# Patient Record
Sex: Female | Born: 1965 | Race: White | Hispanic: No | Marital: Married
Health system: Midwestern US, Academic
[De-identification: ages and names within clinical notes are randomized; demographics above are authoritative.]

---

## 2017-01-27 ENCOUNTER — Encounter: Admit: 2017-01-27 | Discharge: 2017-01-28

## 2017-02-01 ENCOUNTER — Encounter: Admit: 2017-02-01 | Discharge: 2017-02-02

## 2017-03-09 ENCOUNTER — Encounter: Admit: 2017-03-09 | Discharge: 2017-03-10

## 2017-03-16 ENCOUNTER — Encounter: Admit: 2017-03-16 | Discharge: 2017-03-17

## 2017-03-18 ENCOUNTER — Encounter: Admit: 2017-03-18 | Discharge: 2017-03-18 | Payer: Commercial Managed Care - PPO

## 2017-03-19 ENCOUNTER — Encounter: Admit: 2017-03-19 | Discharge: 2017-03-19 | Payer: Commercial Managed Care - PPO

## 2017-03-24 ENCOUNTER — Encounter: Admit: 2017-03-24 | Discharge: 2017-03-24 | Payer: Commercial Managed Care - PPO

## 2017-03-24 ENCOUNTER — Ambulatory Visit: Admit: 2017-03-24 | Discharge: 2017-03-24 | Payer: 59

## 2017-03-24 DIAGNOSIS — J189 Pneumonia, unspecified organism: Principal | ICD-10-CM

## 2017-03-24 DIAGNOSIS — I10 Essential (primary) hypertension: ICD-10-CM

## 2017-03-24 DIAGNOSIS — R911 Solitary pulmonary nodule: Principal | ICD-10-CM

## 2017-03-24 DIAGNOSIS — F17200 Nicotine dependence, unspecified, uncomplicated: ICD-10-CM

## 2017-03-25 ENCOUNTER — Encounter: Admit: 2017-03-25 | Discharge: 2017-03-25 | Payer: Commercial Managed Care - PPO

## 2017-03-25 DIAGNOSIS — I10 Essential (primary) hypertension: ICD-10-CM

## 2017-03-25 DIAGNOSIS — J189 Pneumonia, unspecified organism: Principal | ICD-10-CM

## 2017-03-26 ENCOUNTER — Encounter: Admit: 2017-03-26 | Discharge: 2017-03-26 | Payer: Commercial Managed Care - PPO

## 2017-03-26 LAB — CBC AND DIFF
Lab: 0.8 %
Lab: 10 fL (ref 7.5–12.5)
Lab: 13 % (ref 11.0–15.0)
Lab: 162 {cells}/uL (ref 15–500)
Lab: 2.1 %
Lab: 250 10*3/uL (ref 140–400)
Lab: 263 {cells}/uL (ref 850–3900)
Lab: 29 pg (ref >60)
Lab: 33 g/dL (ref >60)
Lab: 34 %
Lab: 428 {cells}/uL (ref 1500–7800)
Lab: 51 % — ABNORMAL HIGH (ref 35.0–45.0)
Lab: 55 %
Lab: 562 {cells}/uL (ref 200–950)
Lab: 62 {cells}/uL (ref 0–200)
Lab: 7.3 %
Lab: 7.7 10*3/uL (ref 3.8–10.8)
Lab: 87 fL (ref 80.0–100.0)

## 2017-03-26 LAB — PROTIME INR (PT)
Lab: 1 10*6/uL — ABNORMAL HIGH (ref 3.80–5.10)
Lab: 10 s — ABNORMAL HIGH (ref 9.0–11.5)

## 2017-03-27 ENCOUNTER — Encounter: Admit: 2017-03-27 | Discharge: 2017-03-27 | Payer: Commercial Managed Care - PPO

## 2017-03-30 ENCOUNTER — Encounter: Admit: 2017-03-30 | Discharge: 2017-03-30 | Payer: Commercial Managed Care - PPO

## 2017-04-06 ENCOUNTER — Encounter: Admit: 2017-04-06 | Discharge: 2017-04-06 | Payer: Commercial Managed Care - PPO

## 2017-04-06 ENCOUNTER — Ambulatory Visit: Admit: 2017-04-06 | Discharge: 2017-04-06 | Payer: Commercial Managed Care - PPO

## 2017-04-06 ENCOUNTER — Encounter: Admit: 2017-04-06 | Discharge: 2017-04-07 | Payer: 59

## 2017-04-06 DIAGNOSIS — R911 Solitary pulmonary nodule: Secondary | ICD-10-CM

## 2017-04-06 DIAGNOSIS — I10 Essential (primary) hypertension: ICD-10-CM

## 2017-04-06 DIAGNOSIS — J189 Pneumonia, unspecified organism: Principal | ICD-10-CM

## 2017-04-06 MED ORDER — CITALOPRAM 10 MG PO TAB
10 mg | Freq: Every day | ORAL | 0 refills | Status: DC
Start: 2017-04-06 — End: 2017-04-07
  Administered 2017-04-06 – 2017-04-07 (×2): 10 mg via ORAL

## 2017-04-06 MED ORDER — FENTANYL CITRATE (PF) 50 MCG/ML IJ SOLN
50 ug | Freq: Once | INTRAVENOUS | 0 refills | Status: CP
Start: 2017-04-06 — End: ?
  Administered 2017-04-06: 16:00:00 50 ug via INTRAVENOUS

## 2017-04-06 MED ORDER — MORPHINE 4 MG/ML IV CRTG
1-2 mg | Freq: Once | INTRAVENOUS | 0 refills | Status: CP
Start: 2017-04-06 — End: ?
  Administered 2017-04-06: 17:00:00 2 mg via INTRAVENOUS

## 2017-04-06 MED ORDER — LISINOPRIL 2.5 MG PO TAB
2.5 mg | Freq: Every day | ORAL | 0 refills | Status: DC
Start: 2017-04-06 — End: 2017-04-07
  Administered 2017-04-06 – 2017-04-07 (×2): 2.5 mg via ORAL

## 2017-04-06 MED ORDER — LORAZEPAM 2 MG/ML IJ SOLN
1 mg | Freq: Once | INTRAVENOUS | 0 refills | Status: CP
Start: 2017-04-06 — End: ?
  Administered 2017-04-06: 16:00:00 1 mg via INTRAVENOUS

## 2017-04-06 MED ORDER — FENTANYL CITRATE (PF) 50 MCG/ML IJ SOLN
0 refills | Status: CP
Start: 2017-04-06 — End: ?
  Administered 2017-04-06: 16:00:00 50 ug via INTRAVENOUS

## 2017-04-06 MED ORDER — HYDROCHLOROTHIAZIDE 25 MG PO TAB
25 mg | Freq: Every day | ORAL | 0 refills | Status: DC
Start: 2017-04-06 — End: 2017-04-07
  Administered 2017-04-06 – 2017-04-07 (×2): 25 mg via ORAL

## 2017-04-06 MED ORDER — LORAZEPAM 2 MG/ML IJ SOLN
0 refills | Status: CP
Start: 2017-04-06 — End: ?

## 2017-04-06 MED ORDER — SODIUM CHLORIDE 0.9 % IV SOLP
0 refills | Status: CP
Start: 2017-04-06 — End: ?
  Administered 2017-04-06: 17:00:00 125 mL/h via INTRAVENOUS

## 2017-04-06 MED ORDER — OXYCODONE 5 MG PO TAB
5-10 mg | ORAL | 0 refills | Status: DC | PRN
Start: 2017-04-06 — End: 2017-04-07
  Administered 2017-04-06: 22:00:00 10 mg via ORAL

## 2017-04-06 MED ORDER — POLYETHYLENE GLYCOL 3350 17 GRAM PO PWPK
1 | Freq: Every day | ORAL | 0 refills | Status: DC
Start: 2017-04-06 — End: 2017-04-07
  Administered 2017-04-06 – 2017-04-07 (×2): 17 g via ORAL

## 2017-04-06 MED ORDER — ONDANSETRON HCL (PF) 4 MG/2 ML IJ SOLN
4 mg | Freq: Once | INTRAVENOUS | 0 refills | Status: CP
Start: 2017-04-06 — End: ?
  Administered 2017-04-06: 17:00:00 4 mg via INTRAVENOUS

## 2017-04-06 MED ORDER — MIDAZOLAM 1 MG/ML IJ SOLN
0 refills | Status: CP
Start: 2017-04-06 — End: ?
  Administered 2017-04-06 (×2): 1 mg via INTRAVENOUS

## 2017-04-06 MED ORDER — FENTANYL CITRATE (PF) 50 MCG/ML IJ SOLN
25-50 ug | INTRAVENOUS | 0 refills | Status: DC | PRN
Start: 2017-04-06 — End: 2017-04-07
  Administered 2017-04-06: 22:00:00 50 ug via INTRAVENOUS

## 2017-04-06 MED ORDER — ONDANSETRON HCL (PF) 4 MG/2 ML IJ SOLN
4 mg | INTRAVENOUS | 0 refills | Status: DC | PRN
Start: 2017-04-06 — End: 2017-04-07

## 2017-04-06 MED ORDER — MIDAZOLAM 1 MG/ML IJ SOLN
1 mg | Freq: Once | INTRAVENOUS | 0 refills | Status: CP
Start: 2017-04-06 — End: ?
  Administered 2017-04-06: 16:00:00 1 mg via INTRAVENOUS

## 2017-04-07 ENCOUNTER — Encounter: Admit: 2017-04-07 | Discharge: 2017-04-07 | Payer: Commercial Managed Care - PPO

## 2017-04-07 DIAGNOSIS — R911 Solitary pulmonary nodule: Secondary | ICD-10-CM

## 2017-04-07 DIAGNOSIS — J95811 Postprocedural pneumothorax: ICD-10-CM

## 2017-04-07 DIAGNOSIS — F17210 Nicotine dependence, cigarettes, uncomplicated: ICD-10-CM

## 2017-04-07 DIAGNOSIS — I10 Essential (primary) hypertension: ICD-10-CM

## 2017-04-07 DIAGNOSIS — J984 Other disorders of lung: Principal | ICD-10-CM

## 2017-04-07 MED ORDER — OXYCODONE 5 MG PO TAB
5-10 mg | ORAL_TABLET | ORAL | 0 refills | Status: CN | PRN
Start: 2017-04-07 — End: ?

## 2017-04-12 ENCOUNTER — Encounter: Admit: 2017-04-12 | Discharge: 2017-04-12 | Payer: Commercial Managed Care - PPO

## 2017-04-13 ENCOUNTER — Encounter: Admit: 2017-04-13 | Discharge: 2017-04-13 | Payer: Commercial Managed Care - PPO

## 2017-04-13 DIAGNOSIS — R911 Solitary pulmonary nodule: Principal | ICD-10-CM

## 2017-07-28 ENCOUNTER — Ambulatory Visit: Admit: 2017-07-28 | Discharge: 2017-07-28 | Payer: 59

## 2017-07-28 ENCOUNTER — Ambulatory Visit: Admit: 2017-07-28 | Discharge: 2017-07-28 | Payer: Commercial Managed Care - PPO

## 2017-07-28 ENCOUNTER — Encounter: Admit: 2017-07-28 | Discharge: 2017-07-28 | Payer: Commercial Managed Care - PPO

## 2017-07-28 DIAGNOSIS — I10 Essential (primary) hypertension: ICD-10-CM

## 2017-07-28 DIAGNOSIS — F17200 Nicotine dependence, unspecified, uncomplicated: ICD-10-CM

## 2017-07-28 DIAGNOSIS — R911 Solitary pulmonary nodule: Principal | ICD-10-CM

## 2017-07-28 DIAGNOSIS — J189 Pneumonia, unspecified organism: Principal | ICD-10-CM

## 2017-07-28 DIAGNOSIS — R490 Dysphonia: ICD-10-CM

## 2017-07-28 MED ORDER — BENZONATATE 100 MG PO CAP
100 mg | ORAL_CAPSULE | ORAL | 0 refills | 9.00000 days | Status: AC | PRN
Start: 2017-07-28 — End: ?

## 2017-07-29 ENCOUNTER — Encounter: Admit: 2017-07-29 | Discharge: 2017-07-29 | Payer: Commercial Managed Care - PPO

## 2017-07-29 DIAGNOSIS — I10 Essential (primary) hypertension: ICD-10-CM

## 2017-07-29 DIAGNOSIS — J189 Pneumonia, unspecified organism: Principal | ICD-10-CM

## 2017-08-18 ENCOUNTER — Encounter: Admit: 2017-08-18 | Discharge: 2017-09-04 | Payer: 59

## 2017-08-18 ENCOUNTER — Ambulatory Visit: Admit: 2017-08-18 | Discharge: 2017-08-19

## 2017-08-18 MED ORDER — BARIUM SULFATE 40 % (W/V), 30% (W/W) PO PSTE
10 mL | Freq: Once | ORAL | 0 refills | Status: CP
Start: 2017-08-18 — End: ?
  Administered 2017-08-18: 19:00:00 10 mL via ORAL

## 2017-08-18 MED ORDER — BARIUM SULFATE 40 % (W/V) 29% (W/W) PO SUSP
10 mL | Freq: Once | ORAL | 0 refills | Status: CP
Start: 2017-08-18 — End: ?
  Administered 2017-08-18: 19:00:00 10 mL via ORAL

## 2017-08-18 MED ORDER — BARIUM SULFATE 98 % PO SUSR
130 mL | Freq: Once | ORAL | 0 refills | Status: CP
Start: 2017-08-18 — End: ?
  Administered 2017-08-18: 19:00:00 130 mL via ORAL

## 2017-08-18 MED ORDER — SOD BICARB-CITRIC AC-SIMETH 2.21-1.53 GRAM/4 GRAM PO GREP
1 | Freq: Once | ORAL | 0 refills | Status: CP
Start: 2017-08-18 — End: ?

## 2017-08-18 MED ORDER — BARIUM SULFATE 40 % (W/V) PO SUSP
10 mL | Freq: Once | ORAL | 0 refills | Status: CP
Start: 2017-08-18 — End: ?

## 2017-08-18 MED ORDER — BARIUM SULFATE 40 % (W/V) PO POWD
10 mL | Freq: Once | ORAL | 0 refills | Status: CP
Start: 2017-08-18 — End: ?
  Administered 2017-08-18: 19:00:00 10 mL via ORAL

## 2017-08-18 MED ORDER — BARIUM SULFATE 700 MG PO TAB
700 mg | Freq: Once | ORAL | 0 refills | Status: CP
Start: 2017-08-18 — End: ?
  Administered 2017-08-18: 19:00:00 700 mg via ORAL

## 2017-09-03 ENCOUNTER — Encounter: Admit: 2017-09-03 | Discharge: 2017-09-03 | Payer: Commercial Managed Care - PPO

## 2017-09-04 DIAGNOSIS — R490 Dysphonia: ICD-10-CM

## 2017-09-04 DIAGNOSIS — R1312 Dysphagia, oropharyngeal phase: Principal | ICD-10-CM

## 2018-01-12 ENCOUNTER — Encounter: Admit: 2018-01-12 | Discharge: 2018-01-12 | Payer: Commercial Managed Care - PPO

## 2018-01-12 ENCOUNTER — Ambulatory Visit: Admit: 2018-01-12 | Discharge: 2018-01-12 | Payer: Commercial Managed Care - PPO

## 2018-01-12 DIAGNOSIS — R911 Solitary pulmonary nodule: Secondary | ICD-10-CM

## 2018-01-12 DIAGNOSIS — I10 Essential (primary) hypertension: Secondary | ICD-10-CM

## 2018-01-12 DIAGNOSIS — J189 Pneumonia, unspecified organism: Secondary | ICD-10-CM

## 2018-01-12 DIAGNOSIS — R918 Other nonspecific abnormal finding of lung field: Secondary | ICD-10-CM

## 2018-01-12 DIAGNOSIS — F17200 Nicotine dependence, unspecified, uncomplicated: Secondary | ICD-10-CM

## 2018-01-12 DIAGNOSIS — J439 Emphysema, unspecified: ICD-10-CM

## 2018-01-13 ENCOUNTER — Encounter: Admit: 2018-01-13 | Discharge: 2018-01-13 | Payer: Commercial Managed Care - PPO

## 2018-01-13 DIAGNOSIS — I10 Essential (primary) hypertension: Secondary | ICD-10-CM

## 2018-01-13 DIAGNOSIS — J189 Pneumonia, unspecified organism: Secondary | ICD-10-CM

## 2018-06-14 ENCOUNTER — Encounter: Admit: 2018-06-14 | Discharge: 2018-06-14

## 2018-06-22 ENCOUNTER — Encounter: Admit: 2018-06-22 | Discharge: 2018-06-22

## 2018-06-22 NOTE — Telephone Encounter
Received call from pt stating she received a letter that her appointment has been moved from 07/27/2018 to 12/2018.  Pt does not want to wait until December for follow up.    Spoke with pt, offered an appointment on 08/10/2018 at 1:15pm with CT at 930am.    Pt agreeable to plan.

## 2018-08-31 ENCOUNTER — Encounter: Admit: 2018-08-31 | Discharge: 2018-09-01

## 2018-09-06 ENCOUNTER — Encounter: Admit: 2018-09-06 | Discharge: 2018-09-06

## 2018-09-06 NOTE — Telephone Encounter
Pt left voicemail     Pt was exposed to COVID twice and has been tested, both were negative.  Pt has been experiencing increased shortness of breath and saw PCP.  PCP ordered CXR which showed a "spot" on the lungs.  PCP then ordered CT Chest which showed a "spot".  Testing done at [facility] on 8/27/202.  Emailed admin to request imaging be clouded and reports faxed.     Pt reports shortness of breath is constant, but worse with exertion.  Pt does not monitor oxygen levels at home.  On 09/02/2018 oxygen level taken at PCP's office was 94% at rest on room air.  Pt has never used inhalers.     Pt reports dry cough that is worse in the morning, but does last all day.  Pt reports lung pain with inhalation and is a sharp stabbing pain on the right side for approximately 6 weeks.      Pt denies PND, sinus or chest congestion, wheezing, fevers, chills, night sweats.     Advised will request imaging to be clouded and reports and will forward to Dr. Lacinda Axon for [facility].   Pt scheduled with Dr. Valora Piccolo on 09/21/2018.

## 2018-09-09 NOTE — Telephone Encounter
Inda Merlin, RN    Caller: Unspecified (3 days ago, 8:31 AM)              Thanks, Tillie Rung. I reviewed her new CT chest, and the spots on the R look the same as they did in January. Nothing new at this point in terms of imaging. Nothing else I would recommend at this point - obviously should be assessed if she develops fevers. Otherwise Dr. Valora Piccolo will see her in a couple of weeks. Thanks! Christine      RN lvm relaying message per Dr. Lacinda Axon. RN encouraged patient to call with any further questions, otherwise we will see her in clinic in a few weeks.

## 2018-09-21 ENCOUNTER — Ambulatory Visit: Admit: 2018-09-21 | Discharge: 2018-09-21 | Payer: Commercial Managed Care - HMO

## 2018-09-21 ENCOUNTER — Encounter: Admit: 2018-09-21 | Discharge: 2018-09-21 | Payer: Commercial Managed Care - HMO

## 2018-09-21 LAB — CBC AND DIFF
Lab: 0 10*3/uL (ref 0–0.20)
Lab: 0.1 10*3/uL (ref 0–0.45)
Lab: 0.6 10*3/uL (ref 0–0.80)
Lab: 1 % (ref 0–2)
Lab: 12 % (ref 11–15)
Lab: 16 g/dL — ABNORMAL HIGH (ref 12.0–15.0)
Lab: 2 % (ref 0–5)
Lab: 2.4 10*3/uL (ref 1.0–4.8)
Lab: 216 10*3/uL (ref 150–400)
Lab: 29 % (ref 24–44)
Lab: 30 pg (ref 26–34)
Lab: 35 g/dL (ref 32.0–36.0)
Lab: 47 % — ABNORMAL HIGH (ref 36–45)
Lab: 5.2 10*3/uL (ref 1.8–7.0)
Lab: 5.5 M/UL — ABNORMAL HIGH (ref 4.0–5.0)
Lab: 61 % (ref 41–77)
Lab: 7 % (ref 4–12)
Lab: 8.6 10*3/uL (ref 4.5–11.0)
Lab: 8.9 FL (ref 7–11)
Lab: 85 FL (ref 80–100)

## 2018-09-21 LAB — IMMUNOGLOBULIN E (IGE): Lab: 36 [IU]/mL — ABNORMAL HIGH (ref <101)

## 2018-09-21 MED ORDER — FLUTICASONE-UMECLIDIN-VILANTER 100-62.5-25 MCG IN DSDV
1 | Freq: Every day | RESPIRATORY_TRACT | 3 refills | Status: AC
Start: 2018-09-21 — End: ?

## 2018-09-21 NOTE — Patient Instructions
-   Start Trelegy. One puff once daily. Use every morning and rinse mouth out after each use.   - Use Albuterol 1-2 puffs every 4-6 hours as needed for shortness of breath or wheezing  - Labs today on the first floor  - Follow up CT chest in 1 year  - Referral for sleep study  - Follow up in 6 months. You will be called to schedule.    My nurse is Estanislado Spire, Therapist, sports.  She can be reached at [phone].    Please contact my nurse with any signs and symptoms of worsening productive cough with thick secretions, blood in sputum, chest pain or tightness, shortness of breath, fever, chills, night sweats, or any questions or concerns.    For refills on medications, please have your pharmacy request a refill authorization either electronically or via fax to our office at [phone]. Please allow at least 3 business days for refill requests.    For urgent issues after business hours, weekends, or holidays please call [phone] and request for the pulmonary fellow to be paged.

## 2018-09-22 ENCOUNTER — Encounter: Admit: 2018-09-22 | Discharge: 2018-09-22 | Payer: Commercial Managed Care - HMO

## 2018-09-22 LAB — ALPHA-1-ANTITRYPSIN, TOTAL: Lab: 157 K/UL (ref 150–400)

## 2018-09-23 ENCOUNTER — Encounter: Admit: 2018-09-23 | Discharge: 2018-09-23 | Payer: Commercial Managed Care - HMO

## 2018-09-23 NOTE — Telephone Encounter
Called patient to relay results of recent labs. No answer so left voicemail with callback number should she have questions. Labs revealed normal A1AT, IgE, and eos. Hemoglobin remains a bit elevated. Sleep study ordered for sleep disturbance and fatigue and if abnormal may explain polycythemia. If sleep study normal, then may need a formal ex ox to rule out chronic hypoxemia though sats 97% on RA during recent office visit.

## 2018-10-11 ENCOUNTER — Encounter: Admit: 2018-10-11 | Discharge: 2018-10-11 | Payer: Commercial Managed Care - HMO

## 2018-10-11 NOTE — Telephone Encounter
Attempted to reach pt to find out where sleep study order should be faxed.  Left voicemail requesting pt return call.

## 2019-04-04 IMAGING — CT PET/CT WHOLE BODY INITIAL
2 of 4 series · 18 of 25 positions shown · non-contrast
Comparison: Correlation is made with noncontrast CT chest from

NAME:   LETCHER, RICHKHID  

PET/CT WHOLE BODY INITIAL
INDICATION: Right lung mass. Study is performed for further  
 evaluation.
TECHNIQUE: Serum blood glucose level at the time of injection was  
 95 mg/dL. The patient was administered 11.9 mCi F-18 FDG  
 intravenously in the right antecubital location and whole body  
 PET imaging was performed. Noncontrast CT was also performed for  
 attenuation correction and anatomic correlation.

[Series 2: ct wb axials · axial · 3.8mm · 0.98mm/px · z∈[-921,-127]mm · 10 of 299 slices shown]
[im 28/299  soft-tissue]
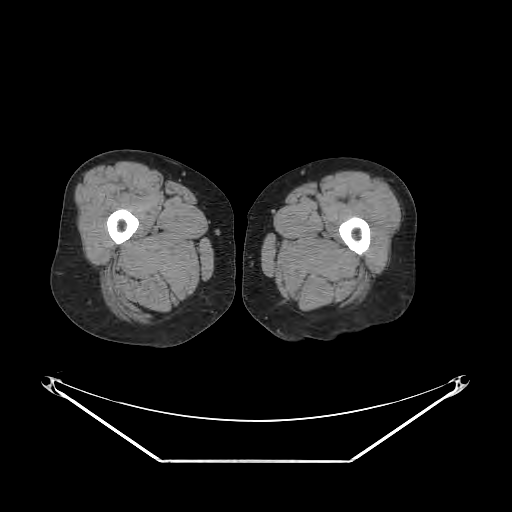
[im 55/299  soft-tissue]
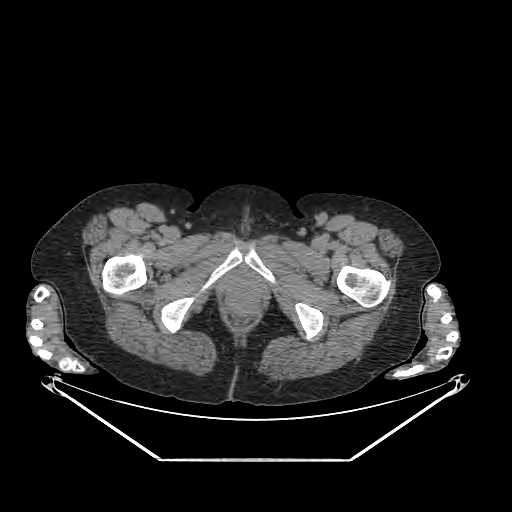
[im 82/299  soft-tissue]
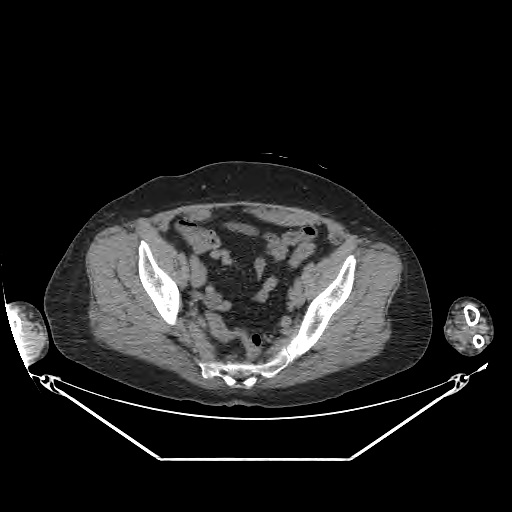
[im 109/299  soft-tissue]
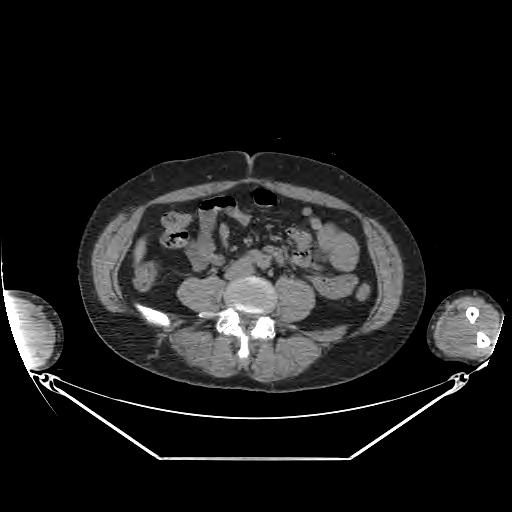
[im 136/299  soft-tissue]
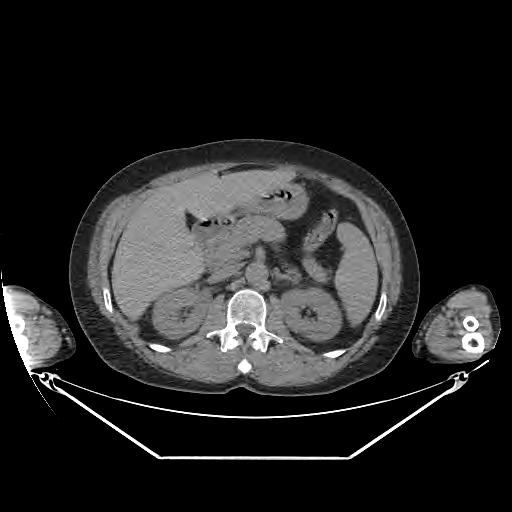
[im 163/299  soft-tissue]
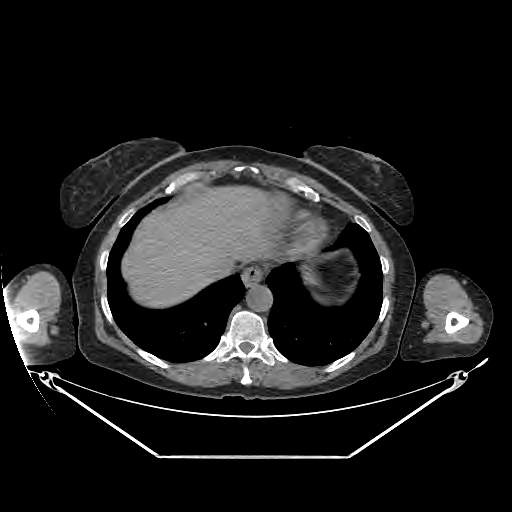
[im 190/299  soft-tissue]
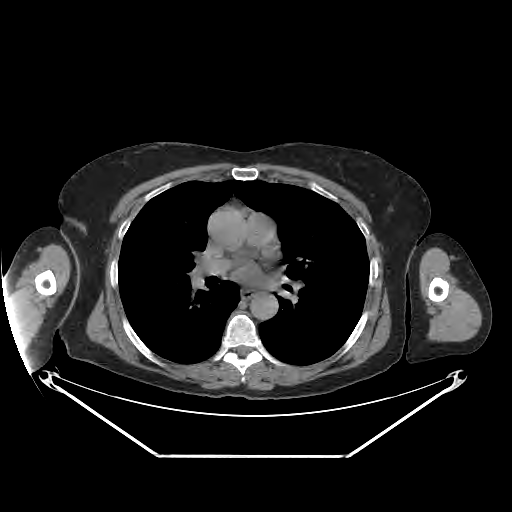
[im 217/299  soft-tissue]
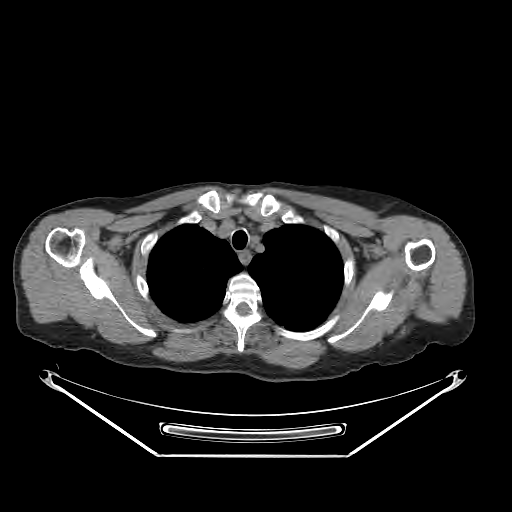
[im 244/299  soft-tissue]
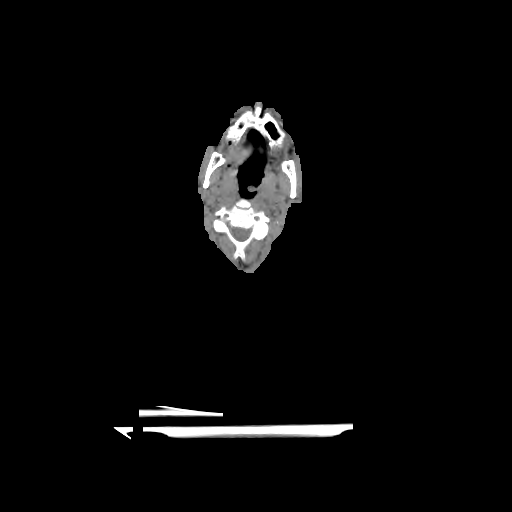
[im 271/299  soft-tissue]
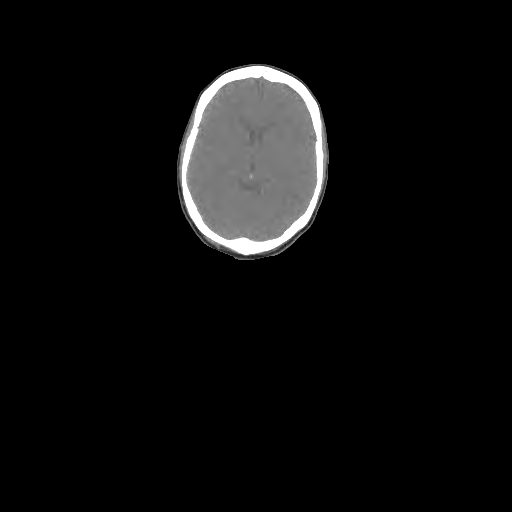

[Series 2: ct leg axials · axial · 3.8mm · 0.98mm/px · z∈[+119,+802]mm · 8 of 263 slices shown]
[im 27/263  soft-tissue]
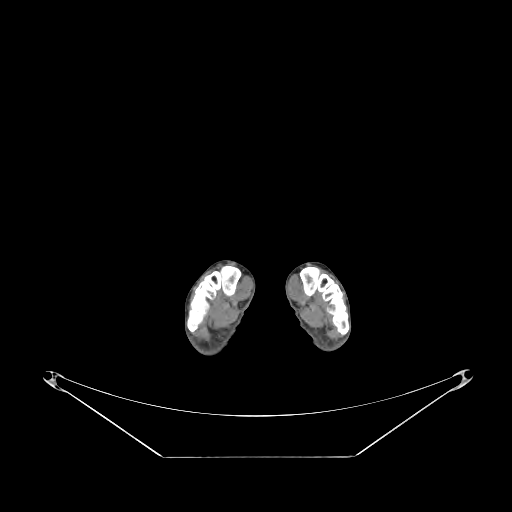
[im 53/263  soft-tissue]
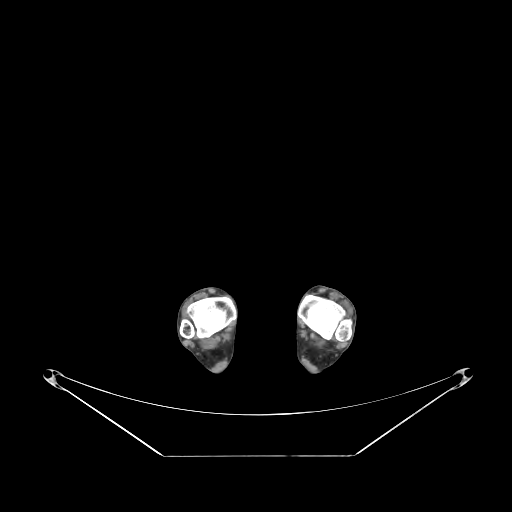
[im 79/263  soft-tissue]
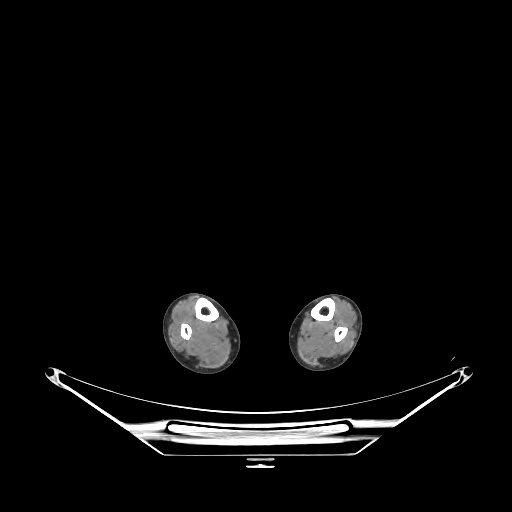
[im 105/263  soft-tissue]
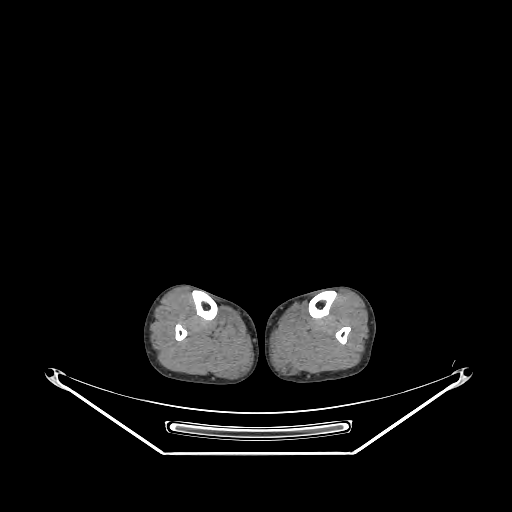
[im 158/263  soft-tissue]
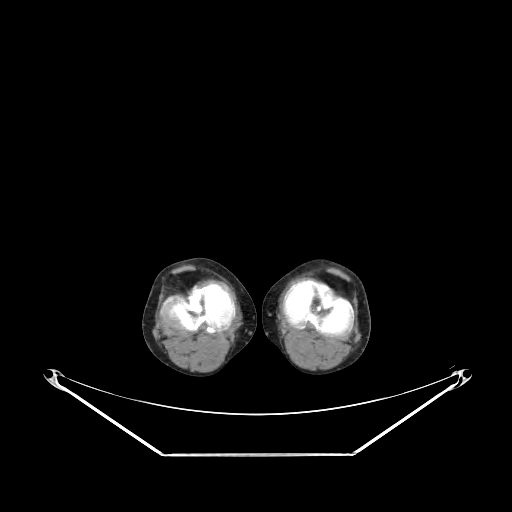
[im 184/263  soft-tissue]
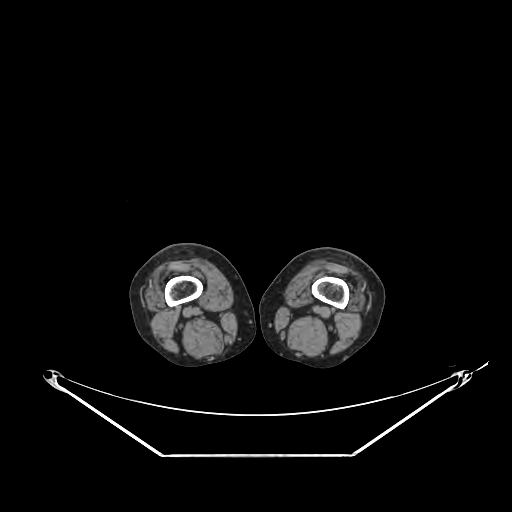
[im 210/263  soft-tissue]
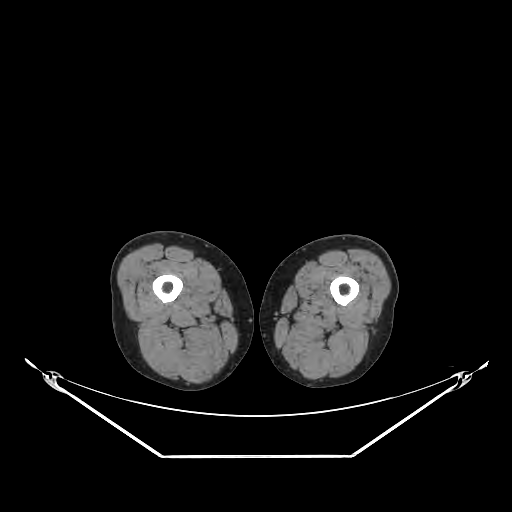
[im 236/263  soft-tissue]
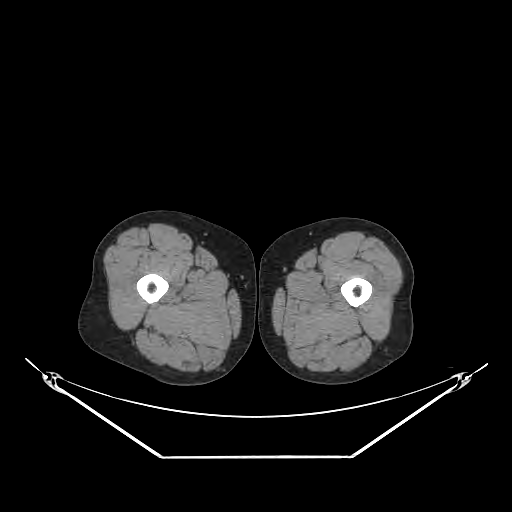

[18 of 25 positions shown; findings below may reference images not displayed]

03/09/2017.  

 FINDINGS; There appears to be symmetric activity throughout the  

 brain. There appears to be physiologic activity within the neck.  

 There is some vague uptake involving the right thyroid lobe but  

 no discrete mass is seen on noncontrast CT. Imaging through the  

 chest is without mediastinal or hilar hypermetabolism. The  

 spiculated nodular density in the right upper lobe on recent CT  

 is without any evidence of FDG uptake. No parenchymal  

 hypermetabolism is seen. There is uptake at the intra-atrial  

 septum consistent with activity within brown fat. Imaging through  

 the abdomen and pelvis is without abnormal uptake. There is  

 physiologic activity within the GI and GU tracts. Lower  

 extremities are unremarkable.
IMPRESSION: Essentially unremarkable whole body PET/CT. The  

 spiculated density in the right upper lobe does not show FDG  

 avidity. While morphologically this lesion does remain suspicious  

 on the noncontrast CT, lack of FDG uptake which could be  

 secondary to more lower grade type neoplasm. Followup chest CT in  

 four to six months is recommended to confirm stability.

## 2019-10-25 ENCOUNTER — Encounter: Admit: 2019-10-25 | Discharge: 2019-10-25 | Payer: Commercial Managed Care - HMO

## 2019-10-25 NOTE — Telephone Encounter
LVM with radiology scheduling number [phone] to schedule imaging exam    -CT chest

## 2019-11-21 ENCOUNTER — Encounter: Admit: 2019-11-21 | Discharge: 2019-11-21 | Payer: Commercial Managed Care - HMO

## 2021-11-27 ENCOUNTER — Emergency Department: Admit: 2021-11-27 | Discharge: 2021-11-27 | Payer: Commercial Managed Care - HMO

## 2021-11-28 ENCOUNTER — Encounter: Admit: 2021-11-28 | Discharge: 2021-11-28 | Payer: Commercial Managed Care - HMO
